# Patient Record
Sex: Female | Born: 1975 | Hispanic: No | Marital: Married | Smoking: Never smoker
Health system: Southern US, Community
[De-identification: ages and names within clinical notes are randomized; demographics above are authoritative.]

## PROBLEM LIST (undated history)

## (undated) DIAGNOSIS — D509 Iron deficiency anemia, unspecified: Secondary | ICD-10-CM

## (undated) HISTORY — DX: Iron deficiency anemia, unspecified: D50.9

---

## 2016-08-21 ENCOUNTER — Other Ambulatory Visit (HOSPITAL_COMMUNITY)
Admission: RE | Admit: 2016-08-21 | Discharge: 2016-08-21 | Disposition: A | Discharge status: Home / Self Care | Payer: BC Managed Care – PPO | Source: Ambulatory Visit | Attending: Family Medicine | Admitting: Family Medicine

## 2016-08-21 ENCOUNTER — Other Ambulatory Visit: Payer: Self-pay | Admitting: Internal Medicine

## 2016-08-21 ENCOUNTER — Other Ambulatory Visit: Payer: Self-pay

## 2016-08-21 ENCOUNTER — Other Ambulatory Visit: Payer: Self-pay | Admitting: Family Medicine

## 2016-08-21 DIAGNOSIS — Z1231 Encounter for screening mammogram for malignant neoplasm of breast: Secondary | ICD-10-CM

## 2016-08-21 DIAGNOSIS — Z124 Encounter for screening for malignant neoplasm of cervix: Secondary | ICD-10-CM | POA: Diagnosis present

## 2016-08-22 LAB — CYTOLOGY - PAP: DIAGNOSIS: NEGATIVE

## 2016-09-02 ENCOUNTER — Ambulatory Visit
Admission: RE | Admit: 2016-09-02 | Discharge: 2016-09-02 | Disposition: A | Discharge status: Home / Self Care | Payer: BC Managed Care – PPO | Source: Ambulatory Visit | Attending: Internal Medicine | Admitting: Internal Medicine

## 2016-09-02 DIAGNOSIS — Z1231 Encounter for screening mammogram for malignant neoplasm of breast: Secondary | ICD-10-CM

## 2018-09-07 ENCOUNTER — Other Ambulatory Visit: Payer: Self-pay | Admitting: Family Medicine

## 2018-09-07 ENCOUNTER — Other Ambulatory Visit (HOSPITAL_COMMUNITY)
Admission: RE | Admit: 2018-09-07 | Discharge: 2018-09-07 | Disposition: A | Discharge status: Home / Self Care | Payer: BC Managed Care – PPO | Source: Ambulatory Visit | Attending: Family Medicine | Admitting: Family Medicine

## 2018-09-07 DIAGNOSIS — Z124 Encounter for screening for malignant neoplasm of cervix: Secondary | ICD-10-CM | POA: Insufficient documentation

## 2018-09-08 ENCOUNTER — Other Ambulatory Visit: Payer: Self-pay | Admitting: Family Medicine

## 2018-09-08 DIAGNOSIS — N852 Hypertrophy of uterus: Secondary | ICD-10-CM

## 2018-09-08 DIAGNOSIS — Z01411 Encounter for gynecological examination (general) (routine) with abnormal findings: Secondary | ICD-10-CM

## 2018-09-09 LAB — CYTOLOGY - PAP: DIAGNOSIS: NEGATIVE

## 2018-09-14 ENCOUNTER — Ambulatory Visit
Admission: RE | Admit: 2018-09-14 | Discharge: 2018-09-14 | Disposition: A | Discharge status: Home / Self Care | Payer: BC Managed Care – PPO | Source: Ambulatory Visit | Attending: Family Medicine | Admitting: Family Medicine

## 2018-09-14 DIAGNOSIS — Z01411 Encounter for gynecological examination (general) (routine) with abnormal findings: Secondary | ICD-10-CM

## 2018-09-14 DIAGNOSIS — N852 Hypertrophy of uterus: Secondary | ICD-10-CM

## 2019-07-12 ENCOUNTER — Encounter: Payer: Self-pay | Admitting: *Deleted

## 2019-07-12 DIAGNOSIS — I479 Paroxysmal tachycardia, unspecified: Secondary | ICD-10-CM

## 2019-07-13 ENCOUNTER — Encounter: Payer: Self-pay | Admitting: Cardiology

## 2019-07-13 ENCOUNTER — Ambulatory Visit (INDEPENDENT_AMBULATORY_CARE_PROVIDER_SITE_OTHER): Payer: BC Managed Care – PPO | Admitting: Cardiology

## 2019-07-13 ENCOUNTER — Other Ambulatory Visit: Payer: Self-pay

## 2019-07-13 VITALS — BP 102/62 | HR 67 | Ht 59.0 in | Wt 91.0 lb

## 2019-07-13 DIAGNOSIS — R42 Dizziness and giddiness: Secondary | ICD-10-CM

## 2019-07-13 DIAGNOSIS — R002 Palpitations: Secondary | ICD-10-CM | POA: Diagnosis not present

## 2019-07-13 NOTE — Progress Notes (Signed)
Cardiology Office Note:    Date:  07/13/2019   ID:  Regina Gonzales, DOB [birth year], MRN [id number]  PCP:  Leighton Ruff, MD  Cardiologist:  Berniece Salines, DO  Electrophysiologist:  None   Referring MD: Christa See, FNP   Chief Complaint  Patient presents with  . New Patient (Initial Visit)    tachycardia. Medications reviewed verbally.    History of Present Illness:    Regina Gonzales is a 43 y.o. female with a hx of iron deficiency anemia who presents with compliants of worsening palpitations. Per patient this started about 2 months ago. She describes it as an abrupt onset of fast heart rate which last for about 10 minutes. She sometimes experiences lightheadedness but denies any chest pain. She notes that in recent weeks the episodes of palpitations have increased and now last longer. Therefore she decided to come get evaluated.  She is here today with her Son [place]. No other complaints at this time.  Past Medical History:  Diagnosis Date  . Iron (Fe) deficiency anemia     History reviewed. No pertinent surgical history.  Current Medications: No outpatient medications have been marked as taking for the 07/13/19 encounter (Office Visit) with Berniece Salines, DO.     Allergies:   Patient has no known allergies.   Social History   Socioeconomic History  . Marital status: Married    Spouse name: [employer]  . Number of children: 3  . Years of education: Not on file  . Highest education level: Not on file  Occupational History  . Not on file  Social Needs  . Financial resource strain: Not on file  . Food insecurity    Worry: Not on file    Inability: Not on file  . Transportation needs    Medical: Not on file    Non-medical: Not on file  Tobacco Use  . Smoking status: Never Smoker  . Smokeless tobacco: Never Used  Substance and Sexual Activity  . Alcohol use: Not Currently    Comment: Rare 2-3 x year  . Drug use: Never  . Sexual activity: Not on file   Lifestyle  . Physical activity    Days per week: Not on file    Minutes per session: Not on file  . Stress: Not on file  Relationships  . Social Herbalist on phone: Not on file    Gets together: Not on file    Attends religious service: Not on file    Active member of club or organization: Not on file    Attends meetings of clubs or organizations: Not on file    Relationship status: Not on file  Other Topics Concern  . Not on file  Social History Narrative  . Not on file     Family History: The patient's family history includes Hypertension in her father and mother; Kidney disease in her mother.  ROS:   Review of Systems  Constitution: Negative for diaphoresis, fever and weight loss.  HENT: Negative for ear pain, hearing loss and sore throat.   Eyes: Negative for blurred vision, pain and redness.  Cardiovascular: Positive for palpitations. Negative for chest pain, leg swelling and paroxysmal nocturnal dyspnea.  Respiratory: Positive for shortness of breath. Negative for cough and wheezing.   Hematologic/Lymphatic: Does not bruise/bleed easily.  Skin: Negative for itching.  Musculoskeletal: Negative for back pain, joint pain and myalgias.  Gastrointestinal: Positive for vomiting. Negative for diarrhea and heartburn.  Genitourinary: Negative  for dysuria, frequency and hematuria.  Neurological: Positive for dizziness. Negative for headaches, tremors and weakness.  Psychiatric/Behavioral: Negative for depression. The patient does not have insomnia and is not nervous/anxious.    EKGs/Labs/Other Studies Reviewed:    The following studies were reviewed today:  EKG:   The ekg ordered today demonstrates sinus rhythm with a heart rate of 63 bpm, with left atrial enlargement.  Recent Labs: Chemistry: Glucose 90, BUN 14, creatinine 0.8, sodium 139, potassium 4.1, chloride 103, bicarb 29, calcium 9.5, total protein 7.0, albumin 4.5, total bili 0.5, alk phos 54, AST 15, ALT  19 CBC WBC 4.3, hemoglobin 13.3, hematocrit 37.7, MCV 94 TSH 1.05 Free T4 0.83 Recent Lipid Panel Lipid panel: Total cholesterol 185, HDL 55, LDL 117, triglycerides 67 (all performed in November 2019.  Physical Exam:    VS:  BP 102/62 (BP Location: Right Arm, Patient Position: Sitting, Cuff Size: Normal)   Pulse 67   Ht 4' 11"  (1.499 m)   Wt 91 lb (41.3 kg)   SpO2 97%   BMI 18.38 kg/m     Wt Readings from Last 3 Encounters:  07/13/19 91 lb (41.3 kg)     GEN: Well nourished, well developed in no acute distress HEENT: Normal NECK: No JVD; No carotid bruits LYMPHATICS: No lymphadenopathy CARDIAC: S1S2, RRR, no murmurs, rubs, gallops RESPIRATORY:  Clear to auscultation without rales, wheezing or rhonchi  ABDOMEN: Soft, non-tender, non-distended EXTREMITIES: No cyanosis, no clubbing and no edema MUSCULOSKELETAL:  No edema; No deformity  SKIN: Warm and dry NEUROLOGIC:  Alert and oriented x 3 PSYCHIATRIC:  Normal affect   ASSESSMENT:    1. Palpitations   2. Dizziness    PLAN:    In order of problems listed above:  1. An ambulatory monitoring patch will be placed on patient for 7 days to assess for any arrhythmias. In addition a TTE will be performed to assess for any structural abnormalities RV LV function.  She is advised to go to the ED if her heart rate increases and become persistent or if she becomes lightheaded or dizzy.  2.  I have reviewed her labs from her primary care physician her TSH is within normal limits.  Her electrolytes are within normal limits.  The patient and her son are at this visit.  She expresses understanding.  She will follow-up in 2 months.   Medication Adjustments/Labs and Tests Ordered: Current medicines are reviewed at length with the patient today.  Concerns regarding medicines are outlined above.  Orders Placed This Encounter  Procedures  . LONG TERM MONITOR (3-14 DAYS)  . EKG 12-Lead  . ECHOCARDIOGRAM COMPLETE   No orders of the  defined types were placed in this encounter.   Patient Instructions  Medication Instructions:  Your physician recommends that you continue on your current medications as directed. Please refer to the Current Medication list given to you today.  If you need a refill on your cardiac medications before your next appointment, please call your pharmacy.   Lab work: None  If you have labs (blood work) drawn today and your tests are completely normal, you will receive your results only by: Marland Kitchen MyChart Message (if you have MyChart) OR . A paper copy in the mail If you have any lab test that is abnormal or we need to change your treatment, we will call you to review the results.  Testing/Procedures: You had an EKG today.   Your physician has requested that you have an echocardiogram.  Echocardiography is a painless test that uses sound waves to create images of your heart. It provides your doctor with information about the size and shape of your heart and how well your heart's chambers and valves are working. This procedure takes approximately one hour. There are no restrictions for this procedure.  Your physician has recommended that you wear a ZIO monitor. ZIO monitors are medical devices that record the heart's electrical activity. Doctors most often use these monitors to diagnose arrhythmias. Arrhythmias are problems with the speed or rhythm of the heartbeat. The monitor is a small, portable device. You can wear one while you do your normal daily activities. This is usually used to diagnose what is causing palpitations/syncope (passing out). Wear for 14 days.   Follow-Up: At [facility], you and your health needs are our priority.  As part of our continuing mission to provide you with exceptional heart care, we have created designated Provider Care Teams.  These Care Teams include your primary Cardiologist (physician) and Advanced Practice Providers (APPs -  Physician Assistants and Nurse  Practitioners) who all work together to provide you with the care you need, when you need it. You will need a follow up appointment in 2 months.     Echocardiogram An echocardiogram is a procedure that uses painless sound waves (ultrasound) to produce an image of the heart. Images from an echocardiogram can provide important information about:  Signs of coronary artery disease (CAD).  Aneurysm detection. An aneurysm is a weak or damaged part of an artery wall that bulges out from the normal force of blood pumping through the body.  Heart size and shape. Changes in the size or shape of the heart can be associated with certain conditions, including heart failure, aneurysm, and CAD.  Heart muscle function.  Heart valve function.  Signs of a past heart attack.  Fluid buildup around the heart.  Thickening of the heart muscle.  A tumor or infectious growth around the heart valves. Tell a health care provider about:  Any allergies you have.  All medicines you are taking, including vitamins, herbs, eye drops, creams, and over-the-counter medicines.  Any blood disorders you have.  Any surgeries you have had.  Any medical conditions you have.  Whether you are pregnant or may be pregnant. What are the risks? Generally, this is a safe procedure. However, problems may occur, including:  Allergic reaction to dye (contrast) that may be used during the procedure. What happens before the procedure? No specific preparation is needed. You may eat and drink normally. What happens during the procedure?   An IV tube may be inserted into one of your veins.  You may receive contrast through this tube. A contrast is an injection that improves the quality of the pictures from your heart.  A gel will be applied to your chest.  A wand-like tool (transducer) will be moved over your chest. The gel will help to transmit the sound waves from the transducer.  The sound waves will harmlessly  bounce off of your heart to allow the heart images to be captured in real-time motion. The images will be recorded on a computer. The procedure may vary among health care providers and hospitals. What happens after the procedure?  You may return to your normal, everyday life, including diet, activities, and medicines, unless your health care provider tells you not to do that. Summary  An echocardiogram is a procedure that uses painless sound waves (ultrasound) to produce an image of  the heart.  Images from an echocardiogram can provide important information about the size and shape of your heart, heart muscle function, heart valve function, and fluid buildup around your heart.  You do not need to do anything to prepare before this procedure. You may eat and drink normally.  After the echocardiogram is completed, you may return to your normal, everyday life, unless your health care provider tells you not to do that. This information is not intended to replace advice given to you by your health care provider. Make sure you discuss any questions you have with your health care provider. Document Released: 10/17/2000 Document Revised: 02/10/2019 Document Reviewed: 11/22/2016 Elsevier Patient Education  [address]      Signed, Berniece Salines, DO  07/13/2019 5:47 PM    [facility]

## 2019-07-13 NOTE — Patient Instructions (Addendum)
Medication Instructions:  Your physician recommends that you continue on your current medications as directed. Please refer to the Current Medication list given to you today.  If you need a refill on your cardiac medications before your next appointment, please call your pharmacy.   Lab work: None  If you have labs (blood work) drawn today and your tests are completely normal, you will receive your results only by: Marland Kitchen. MyChart Message (if you have MyChart) OR . A paper copy in the mail If you have any lab test that is abnormal or we need to change your treatment, we will call you to review the results.  Testing/Procedures: You had an EKG today.   Your physician has requested that you have an echocardiogram. Echocardiography is a painless test that uses sound waves to create images of your heart. It provides your doctor with information about the size and shape of your heart and how well your heart's chambers and valves are working. This procedure takes approximately one hour. There are no restrictions for this procedure.  Your physician has recommended that you wear a ZIO monitor. ZIO monitors are medical devices that record the heart's electrical activity. Doctors most often use these monitors to diagnose arrhythmias. Arrhythmias are problems with the speed or rhythm of the heartbeat. The monitor is a small, portable device. You can wear one while you do your normal daily activities. This is usually used to diagnose what is causing palpitations/syncope (passing out). Wear for 14 days.   Follow-Up: At [facility], you and your health needs are our priority.  As part of our continuing mission to provide you with exceptional heart care, we have created designated Provider Care Teams.  These Care Teams include your primary Cardiologist (physician) and Advanced Practice Providers (APPs -  Physician Assistants and Nurse Practitioners) who all work together to provide you with the care you need, when  you need it. You will need a follow up appointment in 2 months.     Echocardiogram An echocardiogram is a procedure that uses painless sound waves (ultrasound) to produce an image of the heart. Images from an echocardiogram can provide important information about:  Signs of coronary artery disease (CAD).  Aneurysm detection. An aneurysm is a weak or damaged part of an artery wall that bulges out from the normal force of blood pumping through the body.  Heart size and shape. Changes in the size or shape of the heart can be associated with certain conditions, including heart failure, aneurysm, and CAD.  Heart muscle function.  Heart valve function.  Signs of a past heart attack.  Fluid buildup around the heart.  Thickening of the heart muscle.  A tumor or infectious growth around the heart valves. Tell a health care provider about:  Any allergies you have.  All medicines you are taking, including vitamins, herbs, eye drops, creams, and over-the-counter medicines.  Any blood disorders you have.  Any surgeries you have had.  Any medical conditions you have.  Whether you are pregnant or may be pregnant. What are the risks? Generally, this is a safe procedure. However, problems may occur, including:  Allergic reaction to dye (contrast) that may be used during the procedure. What happens before the procedure? No specific preparation is needed. You may eat and drink normally. What happens during the procedure?   An IV tube may be inserted into one of your veins.  You may receive contrast through this tube. A contrast is an injection that improves the  quality of the pictures from your heart.  A gel will be applied to your chest.  A wand-like tool (transducer) will be moved over your chest. The gel will help to transmit the sound waves from the transducer.  The sound waves will harmlessly bounce off of your heart to allow the heart images to be captured in real-time  motion. The images will be recorded on a computer. The procedure may vary among health care providers and hospitals. What happens after the procedure?  You may return to your normal, everyday life, including diet, activities, and medicines, unless your health care provider tells you not to do that. Summary  An echocardiogram is a procedure that uses painless sound waves (ultrasound) to produce an image of the heart.  Images from an echocardiogram can provide important information about the size and shape of your heart, heart muscle function, heart valve function, and fluid buildup around your heart.  You do not need to do anything to prepare before this procedure. You may eat and drink normally.  After the echocardiogram is completed, you may return to your normal, everyday life, unless your health care provider tells you not to do that. This information is not intended to replace advice given to you by your health care provider. Make sure you discuss any questions you have with your health care provider. Document Released: 10/17/2000 Document Revised: 02/10/2019 Document Reviewed: 11/22/2016 Elsevier Patient Education  2020 [employer].

## 2019-07-18 ENCOUNTER — Ambulatory Visit (HOSPITAL_BASED_OUTPATIENT_CLINIC_OR_DEPARTMENT_OTHER): Payer: BC Managed Care – PPO

## 2019-08-02 ENCOUNTER — Telehealth: Payer: Self-pay | Admitting: *Deleted

## 2019-08-02 NOTE — Telephone Encounter (Signed)
[employer] called today to let us know that the pt refuses to wear the zio monitor and is mailing it back.

## 2019-08-03 NOTE — Telephone Encounter (Signed)
Thanks for letting me know!

## 2019-08-23 ENCOUNTER — Telehealth: Payer: Self-pay | Admitting: *Deleted

## 2019-08-23 NOTE — Telephone Encounter (Signed)
Telephone call to patient. Left message that blood work was normal and to call with any questions.

## 2019-08-23 NOTE — Telephone Encounter (Signed)
-----   Message from Berniece Salines, DO sent at 08/22/2019 11:06 PM EDT ----- Please let the patient know that we received her blood work - normal results.

## 2019-09-15 ENCOUNTER — Other Ambulatory Visit (HOSPITAL_COMMUNITY)
Admission: RE | Admit: 2019-09-15 | Discharge: 2019-09-15 | Disposition: A | Discharge status: Home / Self Care | Payer: BC Managed Care – PPO | Source: Ambulatory Visit | Attending: Family Medicine | Admitting: Family Medicine

## 2019-09-15 ENCOUNTER — Other Ambulatory Visit: Payer: Self-pay | Admitting: Family Medicine

## 2019-09-15 DIAGNOSIS — Z124 Encounter for screening for malignant neoplasm of cervix: Secondary | ICD-10-CM | POA: Insufficient documentation

## 2019-09-16 ENCOUNTER — Ambulatory Visit: Payer: BC Managed Care – PPO | Admitting: Cardiology

## 2019-09-21 LAB — CYTOLOGY - PAP
Comment: NEGATIVE
Diagnosis: NEGATIVE
Diagnosis: REACTIVE
High risk HPV: NEGATIVE

## 2020-03-19 ENCOUNTER — Telehealth: Payer: Self-pay | Admitting: Cardiology

## 2020-03-19 NOTE — Telephone Encounter (Signed)
New Message    Pts husband is calling and says the patient would like to get the monitor now     Please call

## 2020-04-04 ENCOUNTER — Other Ambulatory Visit: Payer: Self-pay

## 2020-04-04 ENCOUNTER — Ambulatory Visit (HOSPITAL_COMMUNITY): Payer: BC Managed Care – PPO | Attending: Cardiology

## 2020-04-04 DIAGNOSIS — R42 Dizziness and giddiness: Secondary | ICD-10-CM | POA: Insufficient documentation

## 2020-04-04 DIAGNOSIS — R002 Palpitations: Secondary | ICD-10-CM | POA: Insufficient documentation

## 2020-04-06 ENCOUNTER — Telehealth: Payer: Self-pay

## 2020-04-06 ENCOUNTER — Telehealth: Payer: Self-pay | Admitting: Cardiology

## 2020-04-06 NOTE — Telephone Encounter (Signed)
Patient's husband is calling to follow up. He states he got disconnected from [place].

## 2020-04-06 NOTE — Telephone Encounter (Signed)
-----   Message from Thomasene Ripple, DO sent at 04/05/2020  6:20 PM EDT -----   Echo normal.

## 2020-04-06 NOTE — Telephone Encounter (Signed)
Spoke with patients husband just now after getting verbal ok from the patient over the phone. I let him know the results and he verbalizes understanding.

## 2020-04-06 NOTE — Telephone Encounter (Signed)
Left message on patients voicemail to please return our call.   

## 2020-04-06 NOTE — Telephone Encounter (Signed)
Spoke with the patients husband just now and let him know that Dr. Servando Salina was fine with going ahead and ordering the monitor again. I registered for the patient to get the ZIO monitor mailed to her home and let the husband know that it would arrive in about 1 week. He verbalizes understanding and does not have any other issues or concerns.    Encouraged patient to call back with any questions or concerns.

## 2020-04-06 NOTE — Telephone Encounter (Signed)
Patient's husband following up on heart monitor that was to be mailed. Please advise.

## 2020-04-06 NOTE — Telephone Encounter (Signed)
Transferred call to [place] °

## 2020-04-06 NOTE — Telephone Encounter (Signed)
Sure that will be fine

## 2020-04-06 NOTE — Telephone Encounter (Signed)
Spoke to the patients husband just now. He states that several months back the patient was having financial difficulties and was unable to afford wearing the ZIO monitor that was ordered for her to wear. He is now wondering if the Dr would be ok with going ahead and re-ordering the monitor for the patient as they are now In a position where they can do this.   I will route to Dr. Servando Salina for further recommendations.

## 2020-04-10 NOTE — Telephone Encounter (Signed)
Spoke with pt and let her know that according to [employer] the monitor had been shipped and to give it a few more days. Told her if it is not there by next Monday 6/14 to let us know. She verbalized understanding.

## 2020-04-11 ENCOUNTER — Ambulatory Visit (INDEPENDENT_AMBULATORY_CARE_PROVIDER_SITE_OTHER): Payer: BC Managed Care – PPO

## 2020-04-11 DIAGNOSIS — R42 Dizziness and giddiness: Secondary | ICD-10-CM | POA: Diagnosis not present

## 2020-04-11 DIAGNOSIS — R002 Palpitations: Secondary | ICD-10-CM | POA: Diagnosis not present

## 2020-05-14 ENCOUNTER — Telehealth: Payer: Self-pay

## 2020-05-14 NOTE — Telephone Encounter (Signed)
Left message on patients voicemail to please return our call.   

## 2020-05-14 NOTE — Telephone Encounter (Signed)
-----   Message from Thomasene Ripple, DO sent at 05/14/2020 10:36 AM EDT ----- You have for skipped beats from the bottom of the heart.  This will be symptom driven.  If you are experiencing significant palpitation we can start you on low-dose medicine to see if that is going to help.  If it is tolerable we can monitor you.

## 2020-05-28 NOTE — Telephone Encounter (Signed)
Spoke with patient and patients husband  regarding results and recommendation.  Patient verbalizes understanding and is agreeable to plan of care. Advised patient to call back with any issues or concerns.

## 2020-05-28 NOTE — Telephone Encounter (Signed)
Patient's husband is returning call. 

## 2021-11-26 ENCOUNTER — Other Ambulatory Visit: Payer: Self-pay | Admitting: Family Medicine

## 2021-12-31 ENCOUNTER — Other Ambulatory Visit: Payer: Self-pay | Admitting: Family Medicine

## 2021-12-31 DIAGNOSIS — Z1231 Encounter for screening mammogram for malignant neoplasm of breast: Secondary | ICD-10-CM

## 2022-01-10 ENCOUNTER — Ambulatory Visit
Admission: RE | Admit: 2022-01-10 | Discharge: 2022-01-10 | Disposition: A | Discharge status: Home / Self Care | Payer: BC Managed Care – PPO | Source: Ambulatory Visit | Attending: Family Medicine | Admitting: Family Medicine

## 2022-01-10 DIAGNOSIS — Z1231 Encounter for screening mammogram for malignant neoplasm of breast: Secondary | ICD-10-CM

## 2022-07-15 IMAGING — MG MM DIGITAL SCREENING BILAT W/ TOMO AND CAD
8 series · 9 of 24 positions shown · non-contrast
Comparison: Previous exam(s).

CLINICAL DATA: Screening.

EXAM:
DIGITAL SCREENING BILATERAL MAMMOGRAM WITH TOMOSYNTHESIS AND CAD
TECHNIQUE: Bilateral screening digital craniocaudal and mediolateral oblique
mammograms were obtained. Bilateral screening digital breast
tomosynthesis was performed. The images were evaluated with
computer-aided detection.

[L MLO synth-2D]
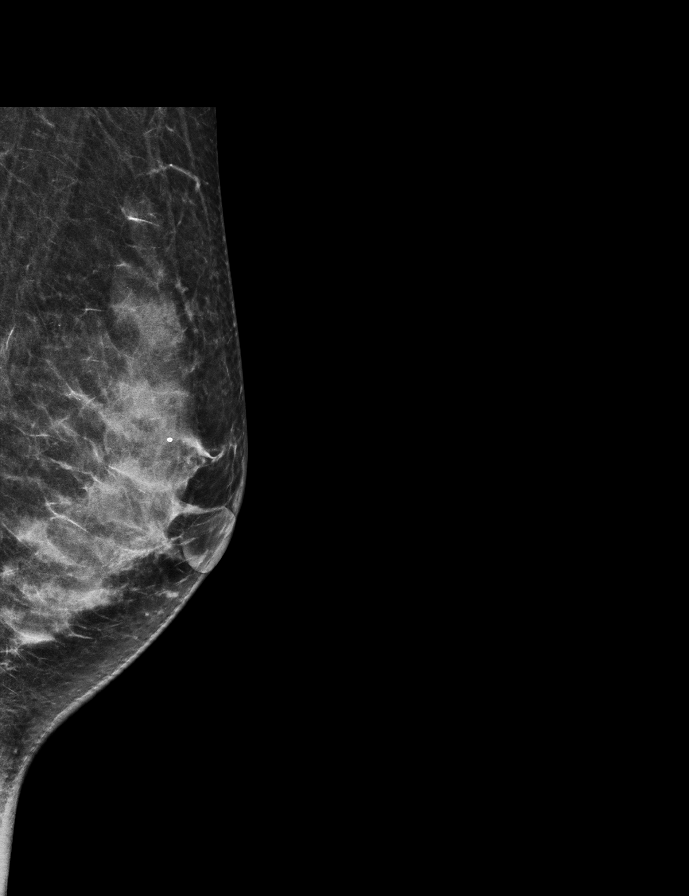

[R MLO synth-2D]
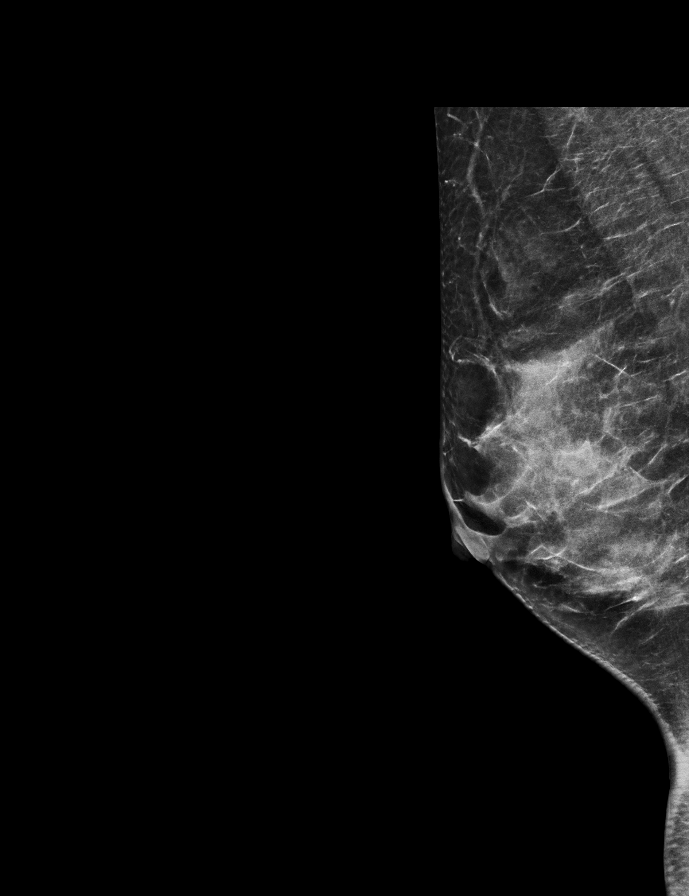

[L CC synth-2D]
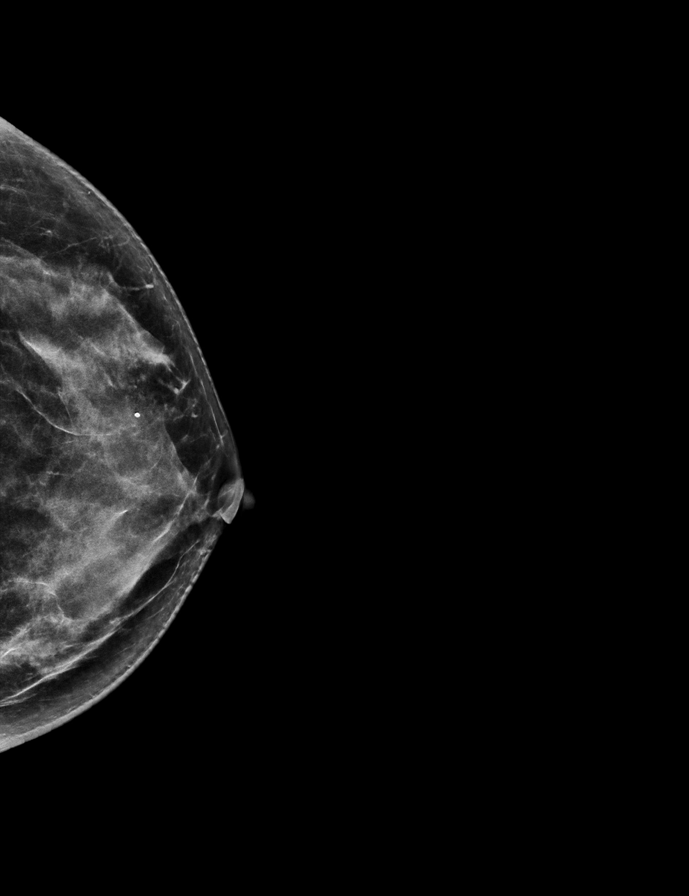

[R CC synth-2D]
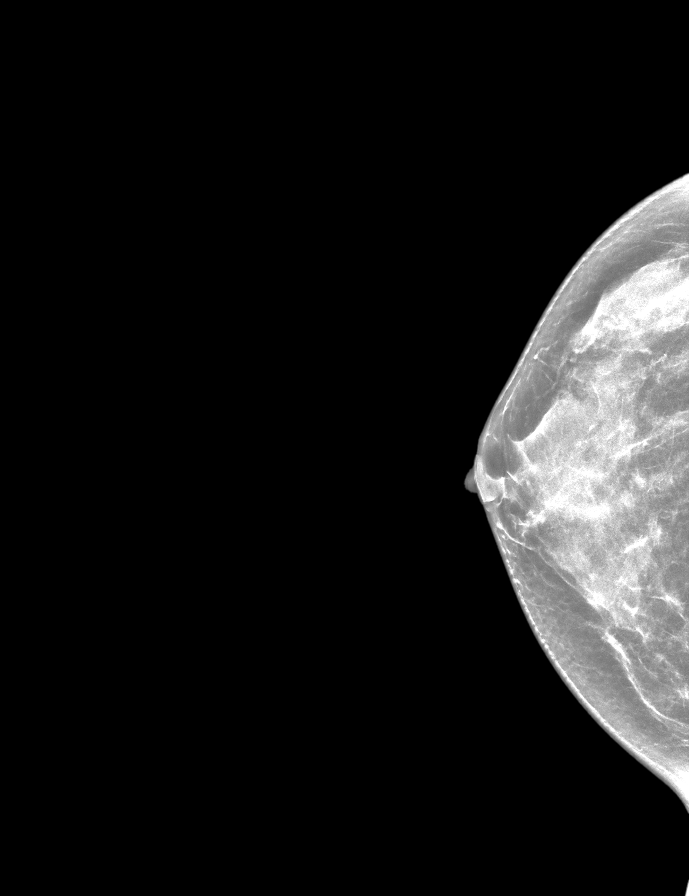

[R CC tomo · 2 of 53 frames shown]
[frame 18/53]
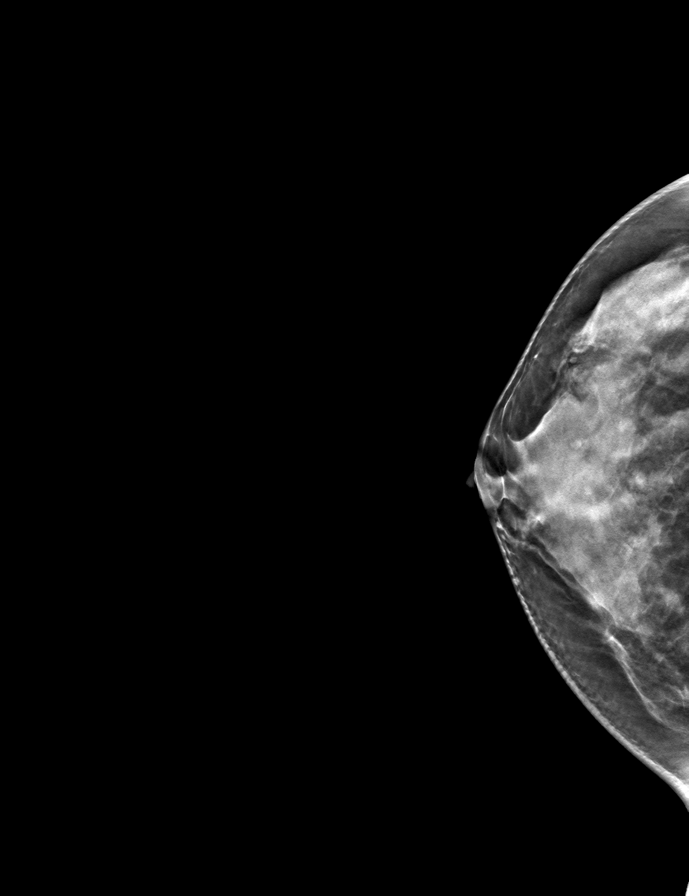
[frame 27/53]
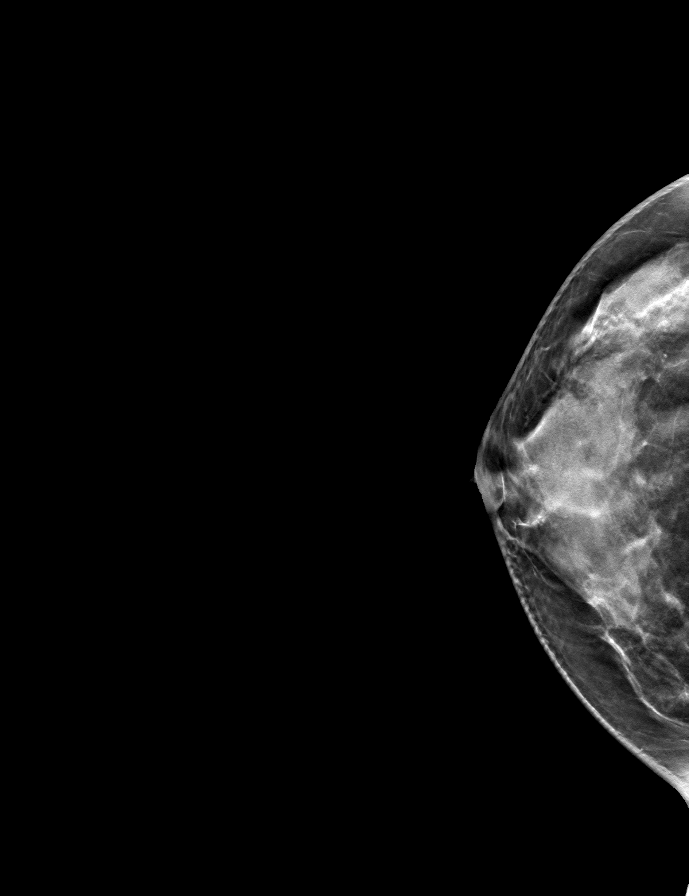

[L MLO tomo · tomo slice 28/55.0]
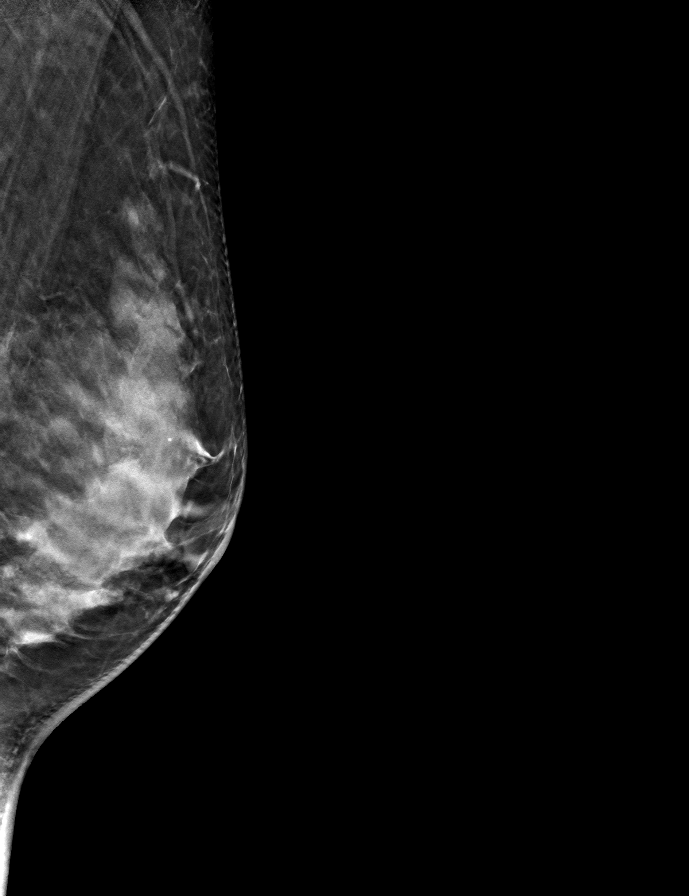

[R MLO tomo · tomo slice 27/54.0]
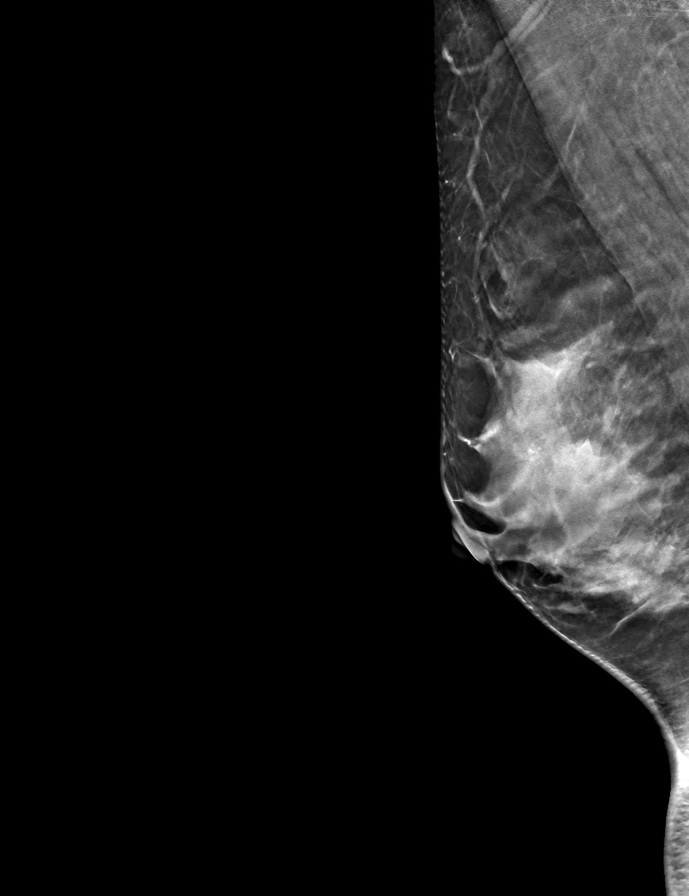

[L CC tomo · tomo slice 30/59.0]
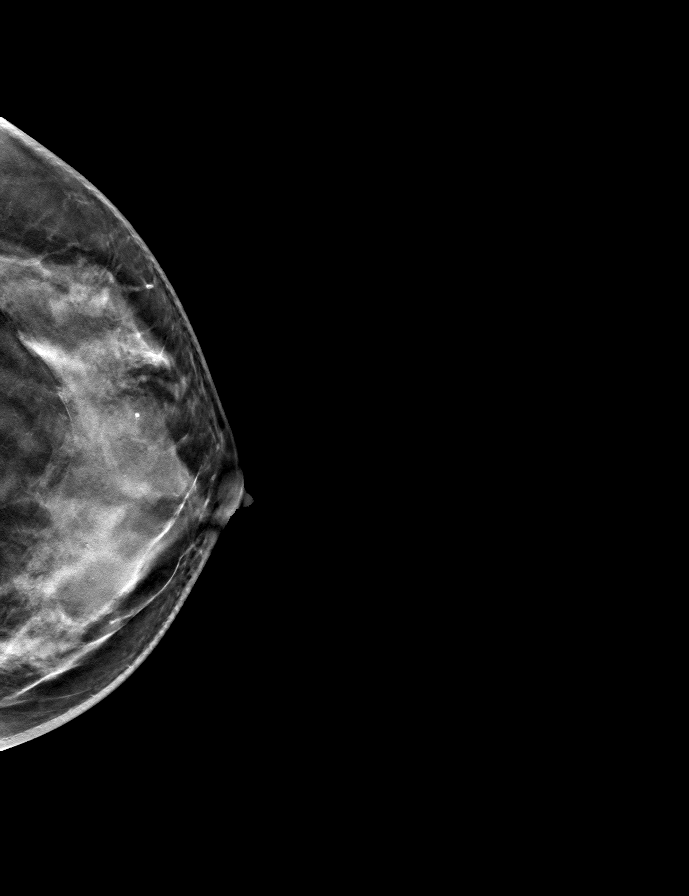

[9 of 24 positions shown; findings below may reference images not displayed]

ACR Breast Density Category d: The breast tissue is extremely dense,
which lowers the sensitivity of mammography
FINDINGS: There are no findings suspicious for malignancy.
IMPRESSION: No mammographic evidence of malignancy. A result letter of this
screening mammogram will be mailed directly to the patient.

RECOMMENDATION:
Screening mammogram in one year. (Code:[id number])

BI-RADS CATEGORY  1: Negative.

## 2023-07-15 ENCOUNTER — Other Ambulatory Visit: Payer: Self-pay | Admitting: Pain Medicine

## 2023-07-15 DIAGNOSIS — Z1231 Encounter for screening mammogram for malignant neoplasm of breast: Secondary | ICD-10-CM

## 2023-07-15 DIAGNOSIS — N923 Ovulation bleeding: Secondary | ICD-10-CM
